# Patient Record
Sex: Male | Born: 1990 | Race: White | Hispanic: No | Marital: Single
Health system: Southern US, Community
[De-identification: ages and names within clinical notes are randomized; demographics above are authoritative.]

---

## 2021-02-22 ENCOUNTER — Other Ambulatory Visit: Payer: Self-pay

## 2021-02-22 ENCOUNTER — Emergency Department (HOSPITAL_COMMUNITY)
Admission: EM | Admit: 2021-02-22 | Discharge: 2021-02-22 | Disposition: A | Discharge status: Home / Self Care | Payer: Federal, State, Local not specified - PPO | Attending: Emergency Medicine | Admitting: Emergency Medicine

## 2021-02-22 ENCOUNTER — Emergency Department (HOSPITAL_COMMUNITY): Payer: Federal, State, Local not specified - PPO

## 2021-02-22 DIAGNOSIS — N4889 Other specified disorders of penis: Secondary | ICD-10-CM | POA: Insufficient documentation

## 2021-02-22 DIAGNOSIS — R1032 Left lower quadrant pain: Secondary | ICD-10-CM | POA: Diagnosis present

## 2021-02-22 DIAGNOSIS — N201 Calculus of ureter: Secondary | ICD-10-CM | POA: Insufficient documentation

## 2021-02-22 LAB — URINALYSIS, ROUTINE W REFLEX MICROSCOPIC
Bacteria, UA: NONE SEEN
Bilirubin Urine: NEGATIVE
Glucose, UA: NEGATIVE mg/dL
Ketones, ur: NEGATIVE mg/dL
Leukocytes,Ua: NEGATIVE
Nitrite: NEGATIVE
Protein, ur: NEGATIVE mg/dL
Specific Gravity, Urine: 1.026 (ref 1.005–1.030)
pH: 5 (ref 5.0–8.0)

## 2021-02-22 LAB — CBC
HCT: 44.5 % (ref 39.0–52.0)
Hemoglobin: 14.7 g/dL (ref 13.0–17.0)
MCH: 28.3 pg (ref 26.0–34.0)
MCHC: 33 g/dL (ref 30.0–36.0)
MCV: 85.7 fL (ref 80.0–100.0)
Platelets: 195 10*3/uL (ref 150–400)
RBC: 5.19 MIL/uL (ref 4.22–5.81)
RDW: 12.8 % (ref 11.5–15.5)
WBC: 9 10*3/uL (ref 4.0–10.5)
nRBC: 0 % (ref 0.0–0.2)

## 2021-02-22 LAB — BASIC METABOLIC PANEL
Anion gap: 11 (ref 5–15)
BUN: 20 mg/dL (ref 6–20)
CO2: 22 mmol/L (ref 22–32)
Calcium: 9.3 mg/dL (ref 8.9–10.3)
Chloride: 104 mmol/L (ref 98–111)
Creatinine, Ser: 1.09 mg/dL (ref 0.61–1.24)
GFR, Estimated: >60 mL/min (ref >60)
Glucose, Bld: 175 mg/dL — ABNORMAL HIGH (ref 70–99)
Potassium: 3.8 mmol/L (ref 3.5–5.1)
Sodium: 137 mmol/L (ref 135–145)

## 2021-02-22 MED ORDER — ONDANSETRON HCL 4 MG PO TABS: 4.0000 mg | ORAL_TABLET | Freq: Four times a day (QID) | ORAL | 0 refills | Status: AC | PRN

## 2021-02-22 MED ORDER — TAMSULOSIN HCL 0.4 MG PO CAPS: 0.4000 mg | ORAL_CAPSULE | Freq: Every day | ORAL | 0 refills | Status: AC

## 2021-02-22 MED ORDER — MORPHINE SULFATE (PF) 4 MG/ML IV SOLN
4.0000 mg | Freq: Once | INTRAVENOUS | Status: AC
  Administered 2021-02-22: 4 mg via INTRAVENOUS
  Filled 2021-02-22: qty 1

## 2021-02-22 MED ORDER — OXYCODONE HCL 5 MG PO TABS: 5.0000 mg | ORAL_TABLET | ORAL | 0 refills | Status: AC | PRN

## 2021-02-22 MED ORDER — ONDANSETRON HCL 4 MG/2ML IJ SOLN
4.0000 mg | Freq: Once | INTRAMUSCULAR | Status: AC
  Administered 2021-02-22: 4 mg via INTRAVENOUS
  Filled 2021-02-22: qty 2

## 2021-02-22 MED ORDER — KETOROLAC TROMETHAMINE 30 MG/ML IJ SOLN
30.0000 mg | Freq: Once | INTRAMUSCULAR | Status: AC
Start: 2021-02-22 — End: 2021-02-22
  Administered 2021-02-22: 30 mg via INTRAVENOUS
  Filled 2021-02-22: qty 1

## 2021-02-22 MED ORDER — LACTATED RINGERS IV BOLUS
1000.0000 mL | Freq: Once | INTRAVENOUS | Status: AC
  Administered 2021-02-22: 1000 mL via INTRAVENOUS

## 2021-02-22 NOTE — ED Triage Notes (Addendum)
Pt presents with left flank pain for a week. Denies blood in urine. Pt think he has a kidney stone but doesn't have a hx.

## 2021-02-22 NOTE — ED Notes (Signed)
Pt transported to CT ?

## 2021-02-22 NOTE — Discharge Instructions (Addendum)
Return if pain is not being adequately controlled, you have uncontrolled vomiting, or if you start running a fever.

## 2021-02-22 NOTE — ED Provider Notes (Signed)
[facility] EMERGENCY DEPARTMENT Provider Note   CSN: [id number] Arrival date & time: 02/22/21  0418   History Chief Complaint  Patient presents with  . Flank Pain    Jesse Sherman is a 30 y.o. male.  The history is provided by the patient and the spouse.  Flank Pain  He has a history of diabetes, and was awakened at 2:30 AM by severe pain in the left flank radiating to the left lower abdomen.  There is associated nausea and vomiting.  He rates pain at 7/10.  Nothing makes it better, nothing makes it worse.  He cannot find a comfortable position.  He also noted some pain at the tip of his penis.  He has urinated but has not seen any blood.  He has never had similar symptoms in the past.  No past medical history on file.  There are no problems to display for this patient.   ** The histories are not reviewed yet. Please review them in the "History" navigator section and refresh this SmartLink.     No family history on file.     Home Medications Prior to Admission medications   Not on File    Allergies    Patient has no allergy information on record.  Review of Systems   Review of Systems  Genitourinary: Positive for flank pain.  All other systems reviewed and are negative.   Physical Exam Updated Vital Signs BP (!) 166/98 (BP Location: Left Arm)   Pulse 79   Temp 98 F (36.7 C) (Oral)   Resp 18   Ht 6\' 1"  (1.854 m)   Wt 110.2 kg   SpO2 98%   BMI 32.06 kg/m   Physical Exam Vitals and nursing note reviewed.   30 year old male, restless and unable to find a comfortable position, but is in no acute distress. Vital signs are significant for elevated blood pressure. Oxygen saturation is 98%, which is normal. Head is normocephalic and atraumatic. PERRLA, EOMI. Oropharynx is clear. Neck is nontender and supple without adenopathy or JVD. Back is nontender in the midline.  There is mild left CVA tenderness. Lungs are clear without rales,  wheezes, or rhonchi. Chest is nontender. Heart has regular rate and rhythm without murmur. Abdomen is soft, flat, with mild left lower quadrant tenderness.  There is no rebound or guarding.  There are no masses or hepatosplenomegaly and peristalsis is hypoactive. Extremities have no cyanosis or edema, full range of motion is present. Skin is warm and dry without rash. Neurologic: Mental status is normal, cranial nerves are intact, there are no motor or sensory deficits.  ED Results / Procedures / Treatments   Labs (all labs ordered are listed, but only abnormal results are displayed) Labs Reviewed  URINALYSIS, ROUTINE W REFLEX MICROSCOPIC - Abnormal; Notable for the following components:      Result Value   APPearance CLOUDY (*)    Hgb urine dipstick LARGE (*)    All other components within normal limits  BASIC METABOLIC PANEL - Abnormal; Notable for the following components:   Glucose, Bld 175 (*)    All other components within normal limits  CBC   Radiology CT Renal Stone Study  Result Date: 02/22/2021 CLINICAL DATA:  Left flank pain EXAM: CT ABDOMEN AND PELVIS WITHOUT CONTRAST TECHNIQUE: Multidetector CT imaging of the abdomen and pelvis was performed following the standard protocol without IV contrast. COMPARISON:  None. FINDINGS: Lower chest: Lung bases are clear. Normal heart  size. No pericardial effusion. Hepatobiliary: No visible focal liver lesion with limitations of this unenhanced CT. Smooth liver surface contour. Diffuse hepatic hypoattenuation compatible with hepatic steatosis. Sparing along the gallbladder fossa. Normal gallbladder and biliary tree. Pancreas: No pancreatic ductal dilatation or surrounding inflammatory changes. Spleen: Normal in size. No concerning splenic lesions. Small accessory splenule towards the hilum. Adrenals/Urinary Tract: Normal adrenal glands. Kidneys are symmetric in size and normally located. Mild asymmetric left urinary tract dilatation with at  most mild hydroureteronephrosis to the level of a punctate 2 mm calculus at the left ureterovesicular junction. No additional urolithiasis or right urinary tract dilatation. No concerning visible or contour deforming renal lesion. Urinary bladder is largely decompressed at the time of exam and therefore poorly evaluated by CT imaging. No gross bladder abnormality. Stomach/Bowel: Distal esophagus, stomach and duodenal sweep are unremarkable. No small bowel wall thickening or dilatation. No evidence of obstruction. A normal appendix is visualized. No colonic dilatation or wall thickening. Vascular/Lymphatic: No significant vascular findings are present. No enlarged abdominal or pelvic lymph nodes. Reproductive: The prostate and seminal vesicles are unremarkable. Other: No abdominopelvic free fluid or free gas. No bowel containing hernias. Tiny fat containing right inguinal and umbilical hernias. Musculoskeletal: No acute or significant osseous findings. IMPRESSION: 1. Mild asymmetric left urinary tract dilatation with at most mild hydroureteronephrosis to the level of a punctate 2 mm calculus at the left ureterovesicular junction. 2. Hepatic steatosis. Electronically Signed   By: Kreg Shropshire M.D.   On: 02/22/2021 06:27    Procedures Procedures   Medications Ordered in ED Medications  ondansetron (ZOFRAN) injection 4 mg (has no administration in time range)  lactated ringers bolus 1,000 mL (has no administration in time range)  ketorolac (TORADOL) 30 MG/ML injection 30 mg (has no administration in time range)  morphine 4 MG/ML injection 4 mg (has no administration in time range)    ED Course  I have reviewed the triage vital signs and the nursing notes.  Pertinent labs & imaging results that were available during my care of the patient were reviewed by me and considered in my medical decision making (see chart for details).  MDM Rules/Calculators/A&P Flank pain and nausea strongly suggestive of  ureterolithiasis.  Consider pyelonephritis, diverticulitis.  Labs show mild hyperglycemia, urine has 21-50 RBCs consistent with ureterolithiasis.  He will be given IV fluids, morphine, ondansetron, ketorolac and will be sent for renal stone protocol CT scan.  He has no prior records in the [facility] health system or Care Everywhere.  CT scan shows 2 mm distal left ureteral calculus.  Patient feels much better after above-noted treatment.  He is discharged with prescriptions for tamsulosin, ondansetron, oxycodone.  Return precautions discussed.  Final Clinical Impression(s) / ED Diagnoses Final diagnoses:  Ureterolithiasis    Rx / DC Orders ED Discharge Orders         Ordered    tamsulosin (FLOMAX) 0.4 MG CAPS capsule  Daily        02/22/21 0709    ondansetron (ZOFRAN) 4 MG tablet  Every 6 hours PRN        02/22/21 0709    oxyCODONE (ROXICODONE) 5 MG immediate release tablet  Every 4 hours PRN        02/22/21 0709           Dione Booze, MD 02/22/21 [phone]

## 2022-04-30 IMAGING — CT CT RENAL STONE PROTOCOL
2 of 4 series · 16 of 46 positions shown, 18 images · non-contrast
Comparison: None.

CLINICAL DATA: Left flank pain

EXAM:
CT ABDOMEN AND PELVIS WITHOUT CONTRAST
TECHNIQUE: Multidetector CT imaging of the abdomen and pelvis was performed
following the standard protocol without IV contrast.

[Series 3: renal stone 5.0 · axial · 0.89mm/px · z∈[+833,+1308]mm · 13 of 105 slices shown, 15 images]
[im 5/105  soft-tissue]
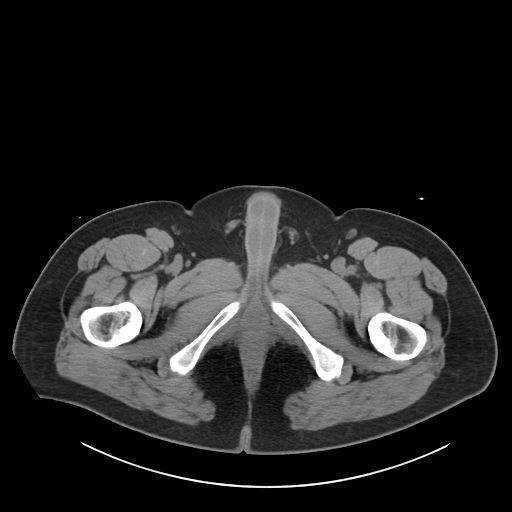
[im 5/105  bone]
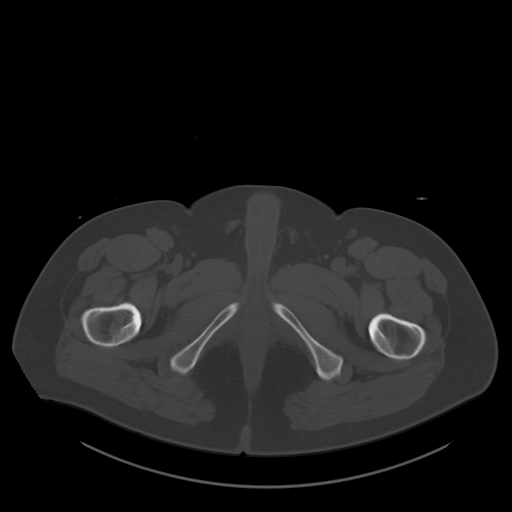
[im 13/105  soft-tissue]
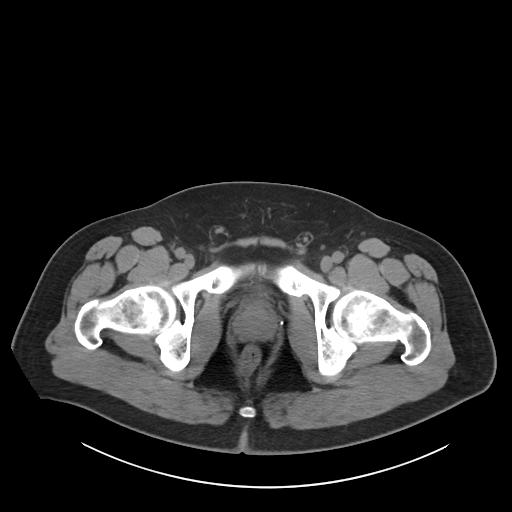
[im 21/105  soft-tissue]
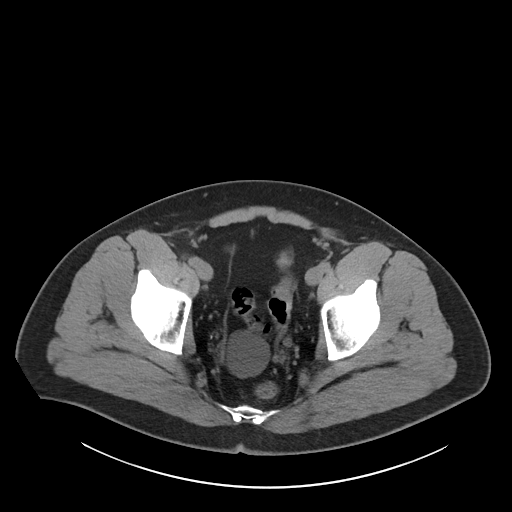
[im 30/105  soft-tissue]
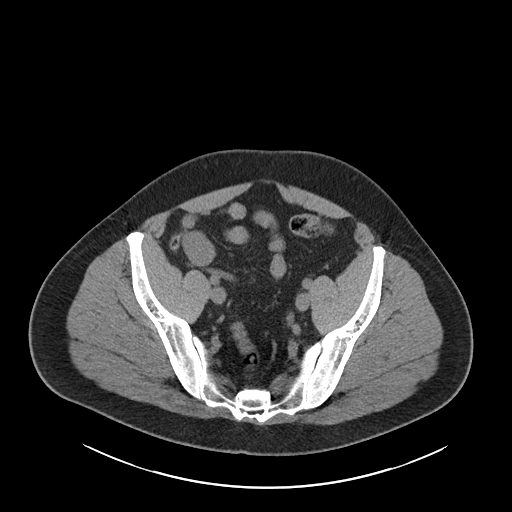
[im 38/105  soft-tissue]
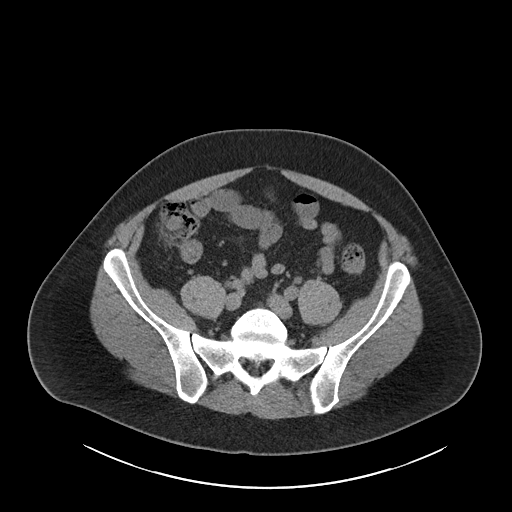
[im 46/105  soft-tissue]
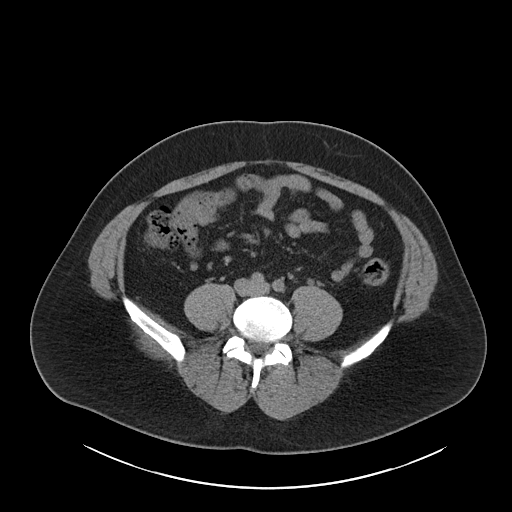
[im 55/105  soft-tissue]
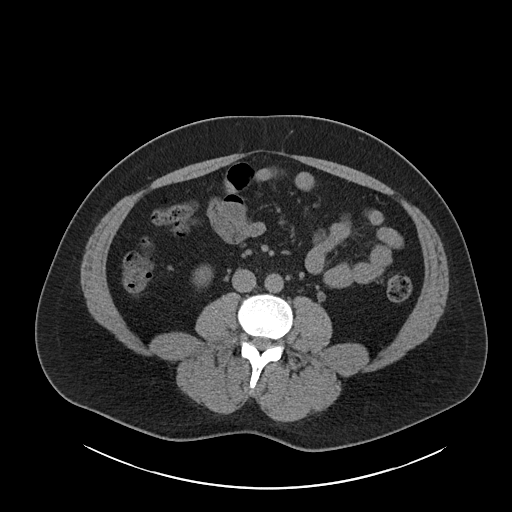
[im 59/105  soft-tissue]
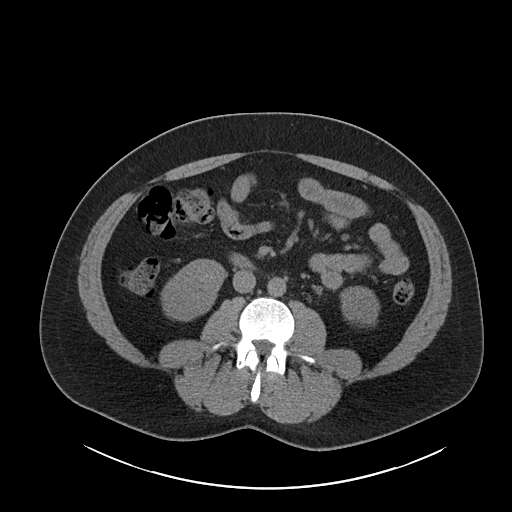
[im 67/105  soft-tissue]
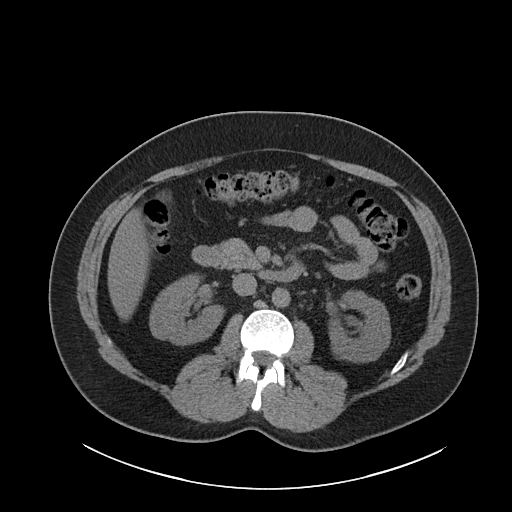
[im 67/105  bone]
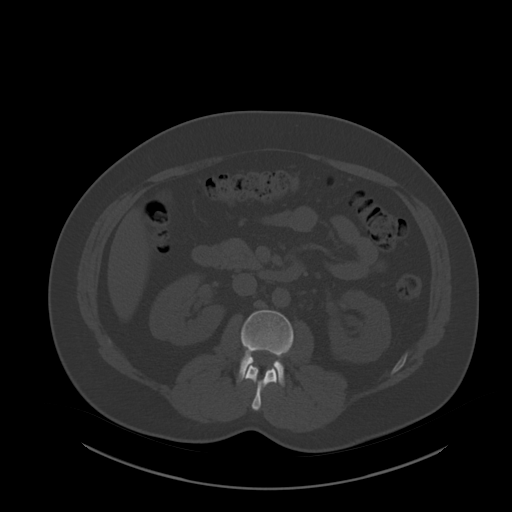
[im 75/105  soft-tissue]
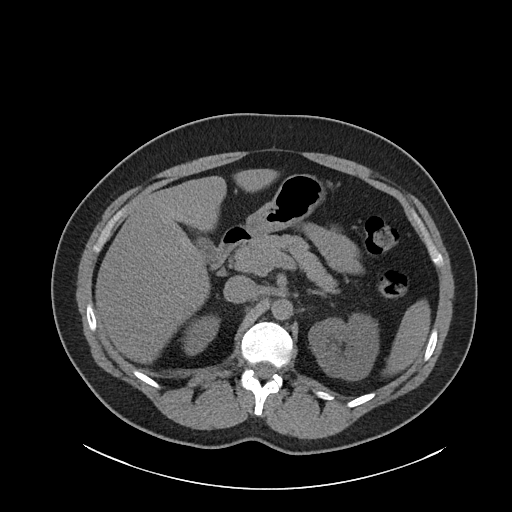
[im 84/105  soft-tissue]
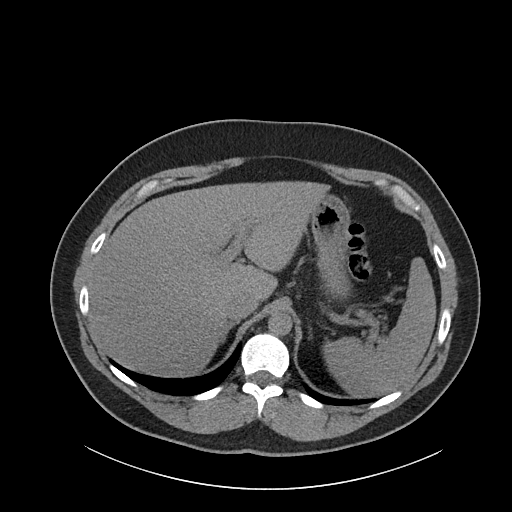
[im 92/105  soft-tissue]
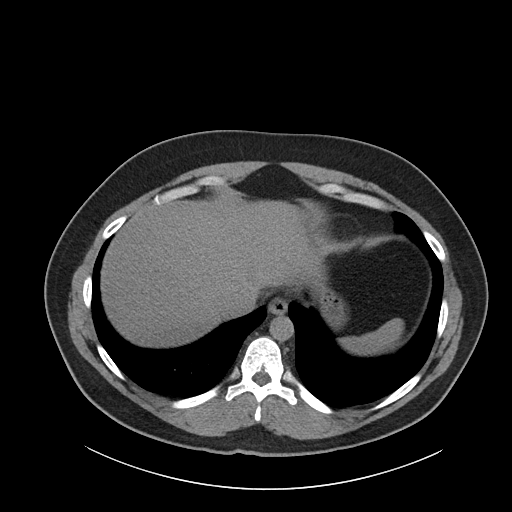
[im 100/105  soft-tissue]
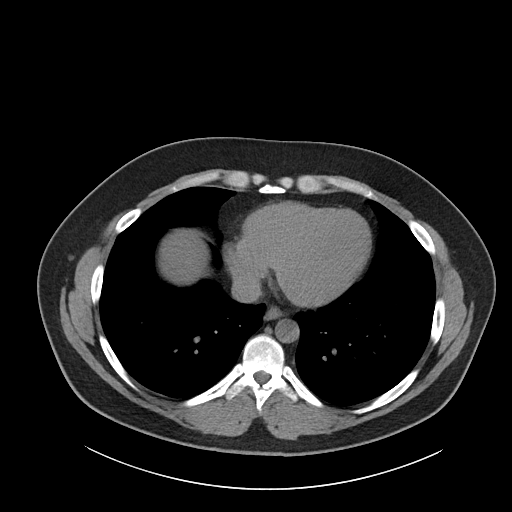

[Series 5: coronal · coronal · 0.92mm/px · 3 of 112 slices shown]
[im 38/112  soft-tissue]
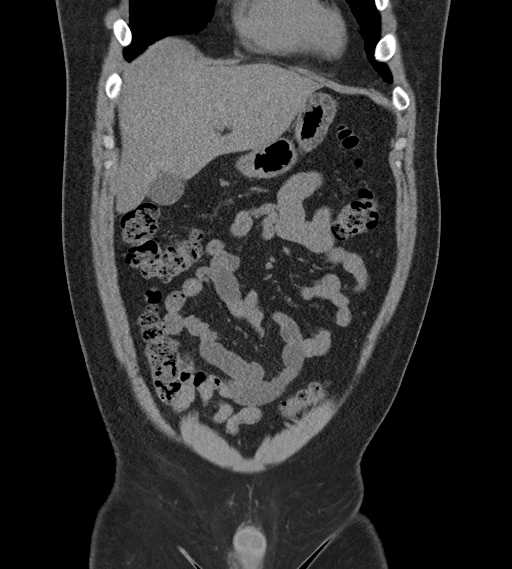
[im 50/112  soft-tissue]
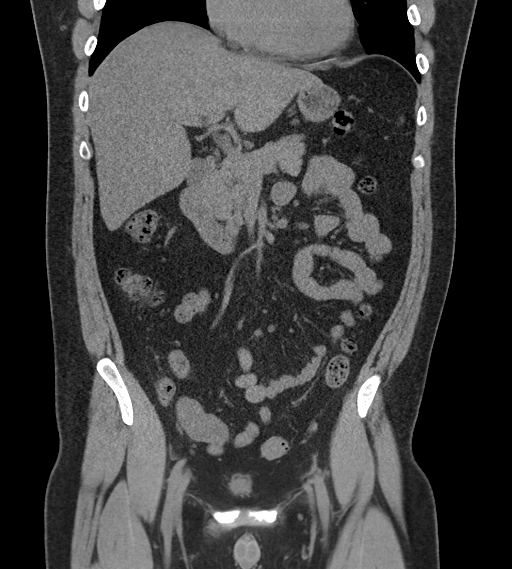
[im 62/112  soft-tissue]
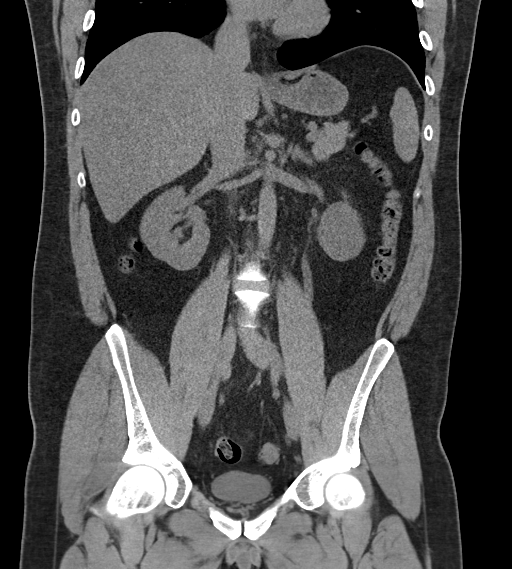

[16 of 46 positions shown; findings below may reference images not displayed]

FINDINGS: Lower chest: Lung bases are clear. Normal heart size. No pericardial
effusion.

Hepatobiliary: No visible focal liver lesion with limitations of
this unenhanced CT. Smooth liver surface contour. Diffuse hepatic
hypoattenuation compatible with hepatic steatosis. Sparing along the
gallbladder fossa. Normal gallbladder and biliary tree.

Pancreas: No pancreatic ductal dilatation or surrounding
inflammatory changes.

Spleen: Normal in size. No concerning splenic lesions. Small
accessory splenule towards the hilum.

Adrenals/Urinary Tract: Normal adrenal glands. Kidneys are symmetric
in size and normally located. Mild asymmetric left urinary tract
dilatation with at most mild hydroureteronephrosis to the level of a
punctate 2 mm calculus at the left ureterovesicular junction. No
additional urolithiasis or right urinary tract dilatation. No
concerning visible or contour deforming renal lesion. Urinary
bladder is largely decompressed at the time of exam and therefore
poorly evaluated by CT imaging. No gross bladder abnormality.

Stomach/Bowel: Distal esophagus, stomach and duodenal sweep are
unremarkable. No small bowel wall thickening or dilatation. No
evidence of obstruction. A normal appendix is visualized. No colonic
dilatation or wall thickening.

Vascular/Lymphatic: No significant vascular findings are present. No
enlarged abdominal or pelvic lymph nodes.

Reproductive: The prostate and seminal vesicles are unremarkable.

Other: No abdominopelvic free fluid or free gas. No bowel containing
hernias. Tiny fat containing right inguinal and umbilical hernias.

Musculoskeletal: No acute or significant osseous findings.
IMPRESSION: 1. Mild asymmetric left urinary tract dilatation with at most mild
hydroureteronephrosis to the level of a punctate 2 mm calculus at
the left ureterovesicular junction.
2. Hepatic steatosis.
